# Patient Record
Sex: Male | Born: 1950 | Race: White | Hispanic: No | Marital: Married | Smoking: Never smoker
Health system: Southern US, Community
[De-identification: ages and names within clinical notes are randomized; demographics above are authoritative.]

## PROBLEM LIST (undated history)

## (undated) DIAGNOSIS — Z87442 Personal history of urinary calculi: Secondary | ICD-10-CM

## (undated) DIAGNOSIS — I10 Essential (primary) hypertension: Secondary | ICD-10-CM

## (undated) HISTORY — PX: COLONOSCOPY: SHX174

---

## 2008-07-25 ENCOUNTER — Ambulatory Visit: Payer: Self-pay | Admitting: Gastroenterology

## 2009-01-13 ENCOUNTER — Inpatient Hospital Stay: Payer: Self-pay | Admitting: Internal Medicine

## 2009-01-17 ENCOUNTER — Ambulatory Visit: Payer: Self-pay | Admitting: Oncology

## 2009-02-06 ENCOUNTER — Ambulatory Visit: Payer: Self-pay | Admitting: Oncology

## 2009-02-17 ENCOUNTER — Ambulatory Visit: Payer: Self-pay | Admitting: Oncology

## 2019-08-08 ENCOUNTER — Other Ambulatory Visit: Payer: Self-pay

## 2019-08-08 ENCOUNTER — Other Ambulatory Visit (HOSPITAL_COMMUNITY): Payer: Self-pay | Admitting: Internal Medicine

## 2019-08-08 ENCOUNTER — Ambulatory Visit
Admission: RE | Admit: 2019-08-08 | Discharge: 2019-08-08 | Disposition: A | Discharge status: Home / Self Care | Payer: Medicare Other | Source: Ambulatory Visit | Attending: Internal Medicine | Admitting: Internal Medicine

## 2019-08-08 ENCOUNTER — Other Ambulatory Visit: Payer: Self-pay | Admitting: Internal Medicine

## 2019-08-08 DIAGNOSIS — R0609 Other forms of dyspnea: Secondary | ICD-10-CM

## 2019-08-08 DIAGNOSIS — R0600 Dyspnea, unspecified: Secondary | ICD-10-CM | POA: Insufficient documentation

## 2019-08-08 DIAGNOSIS — R0789 Other chest pain: Secondary | ICD-10-CM

## 2019-08-08 HISTORY — DX: Essential (primary) hypertension: I10

## 2019-08-08 MED ORDER — IOHEXOL 350 MG/ML SOLN
75.0000 mL | Freq: Once | INTRAVENOUS | Status: AC | PRN
  Administered 2019-08-08: 75 mL via INTRAVENOUS

## 2020-09-07 LAB — EXTERNAL GENERIC LAB PROCEDURE: COLOGUARD: NEGATIVE

## 2020-11-02 ENCOUNTER — Other Ambulatory Visit: Payer: Self-pay

## 2020-11-02 ENCOUNTER — Other Ambulatory Visit: Payer: Self-pay | Admitting: Physician Assistant

## 2020-11-02 ENCOUNTER — Ambulatory Visit
Admission: RE | Admit: 2020-11-02 | Discharge: 2020-11-02 | Disposition: A | Discharge status: Home / Self Care | Payer: Medicare Other | Source: Ambulatory Visit | Attending: Physician Assistant | Admitting: Physician Assistant

## 2020-11-02 DIAGNOSIS — R109 Unspecified abdominal pain: Secondary | ICD-10-CM | POA: Diagnosis not present

## 2020-11-07 ENCOUNTER — Other Ambulatory Visit: Payer: Self-pay

## 2020-11-07 ENCOUNTER — Encounter: Payer: Self-pay | Admitting: Urology

## 2020-11-07 ENCOUNTER — Other Ambulatory Visit: Payer: Self-pay | Admitting: Urology

## 2020-11-07 ENCOUNTER — Ambulatory Visit
Admission: RE | Admit: 2020-11-07 | Discharge: 2020-11-07 | Disposition: A | Discharge status: Home / Self Care | Payer: Medicare Other | Source: Ambulatory Visit | Attending: Urology | Admitting: Urology

## 2020-11-07 ENCOUNTER — Ambulatory Visit (INDEPENDENT_AMBULATORY_CARE_PROVIDER_SITE_OTHER): Payer: Medicare Other | Admitting: Urology

## 2020-11-07 VITALS — BP 109/67 | HR 82 | Ht 71.0 in | Wt 215.0 lb

## 2020-11-07 DIAGNOSIS — N200 Calculus of kidney: Secondary | ICD-10-CM | POA: Diagnosis not present

## 2020-11-07 DIAGNOSIS — N201 Calculus of ureter: Secondary | ICD-10-CM

## 2020-11-07 DIAGNOSIS — N132 Hydronephrosis with renal and ureteral calculous obstruction: Secondary | ICD-10-CM

## 2020-11-07 IMAGING — CR DG ABDOMEN 1V
2 series · 2 of 2 positions shown · non-contrast
Comparison: CT from [DATE]

CLINICAL DATA: Right-sided flank pain for 5 days

EXAM:
ABDOMEN - 1 VIEW

[abdomen kub (1 of 2)]
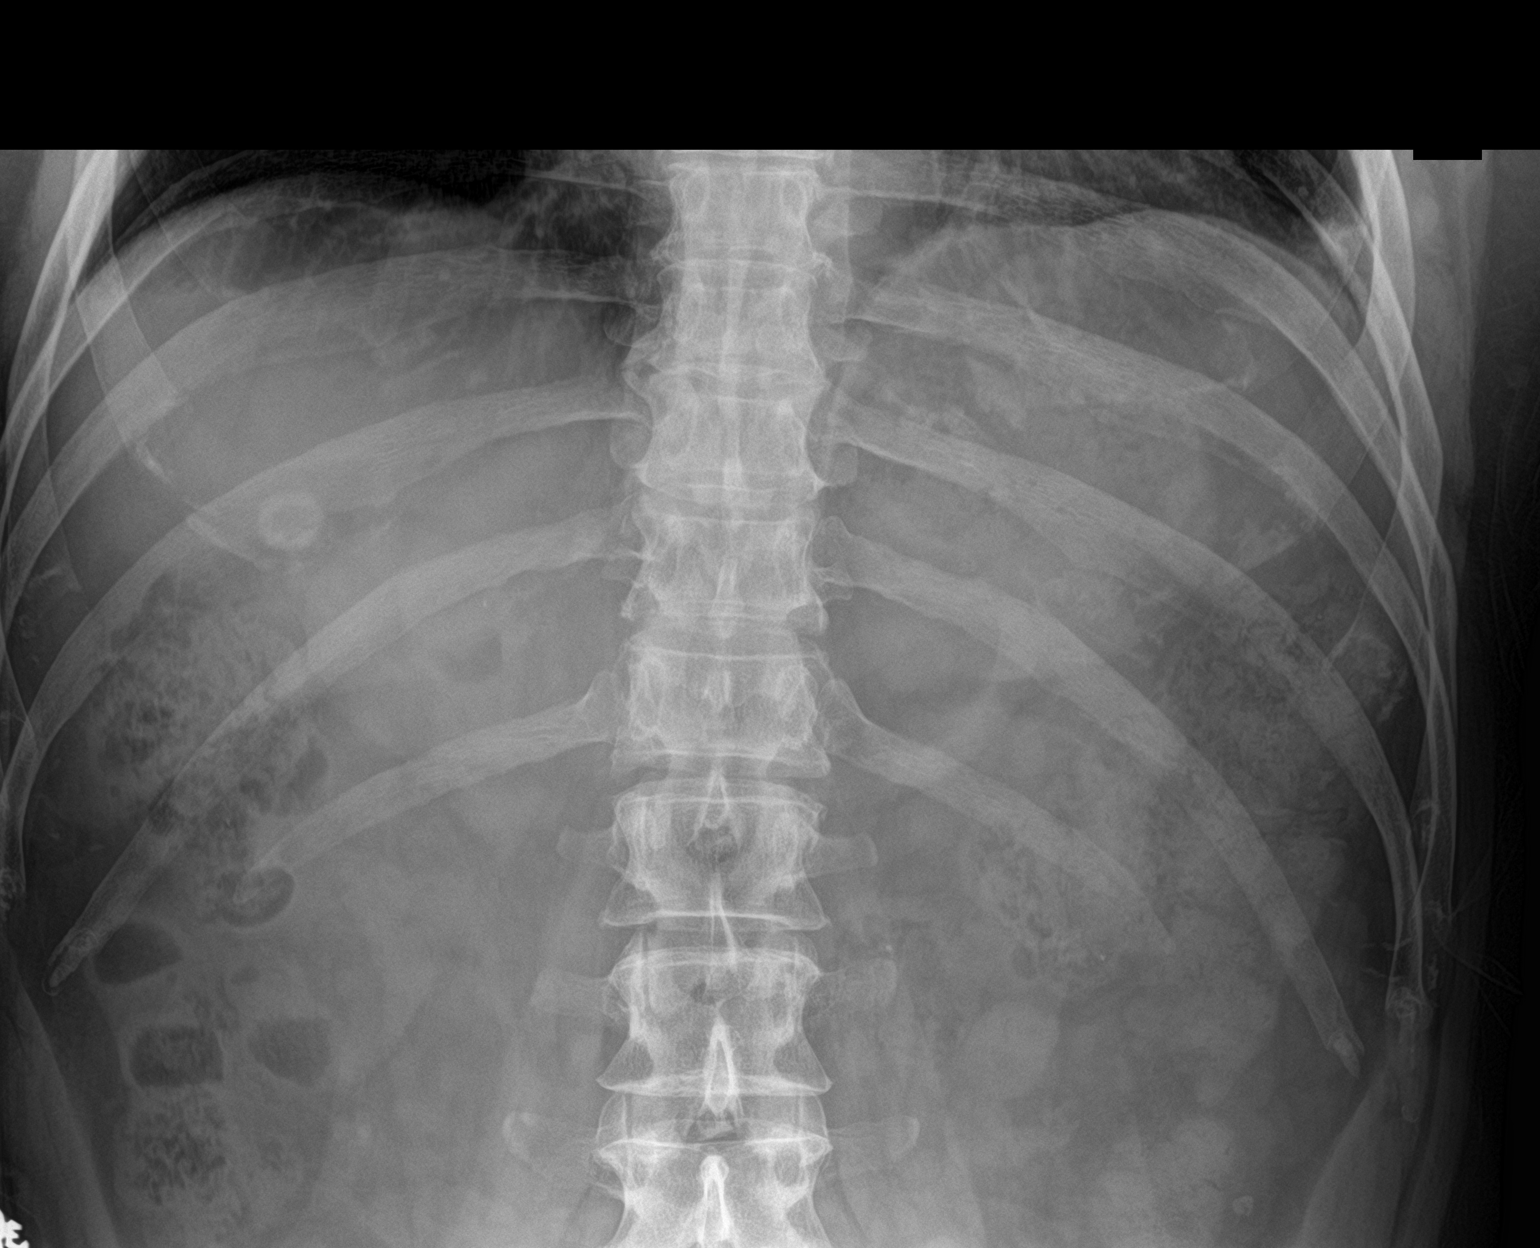

[abdomen kub (2 of 2)]
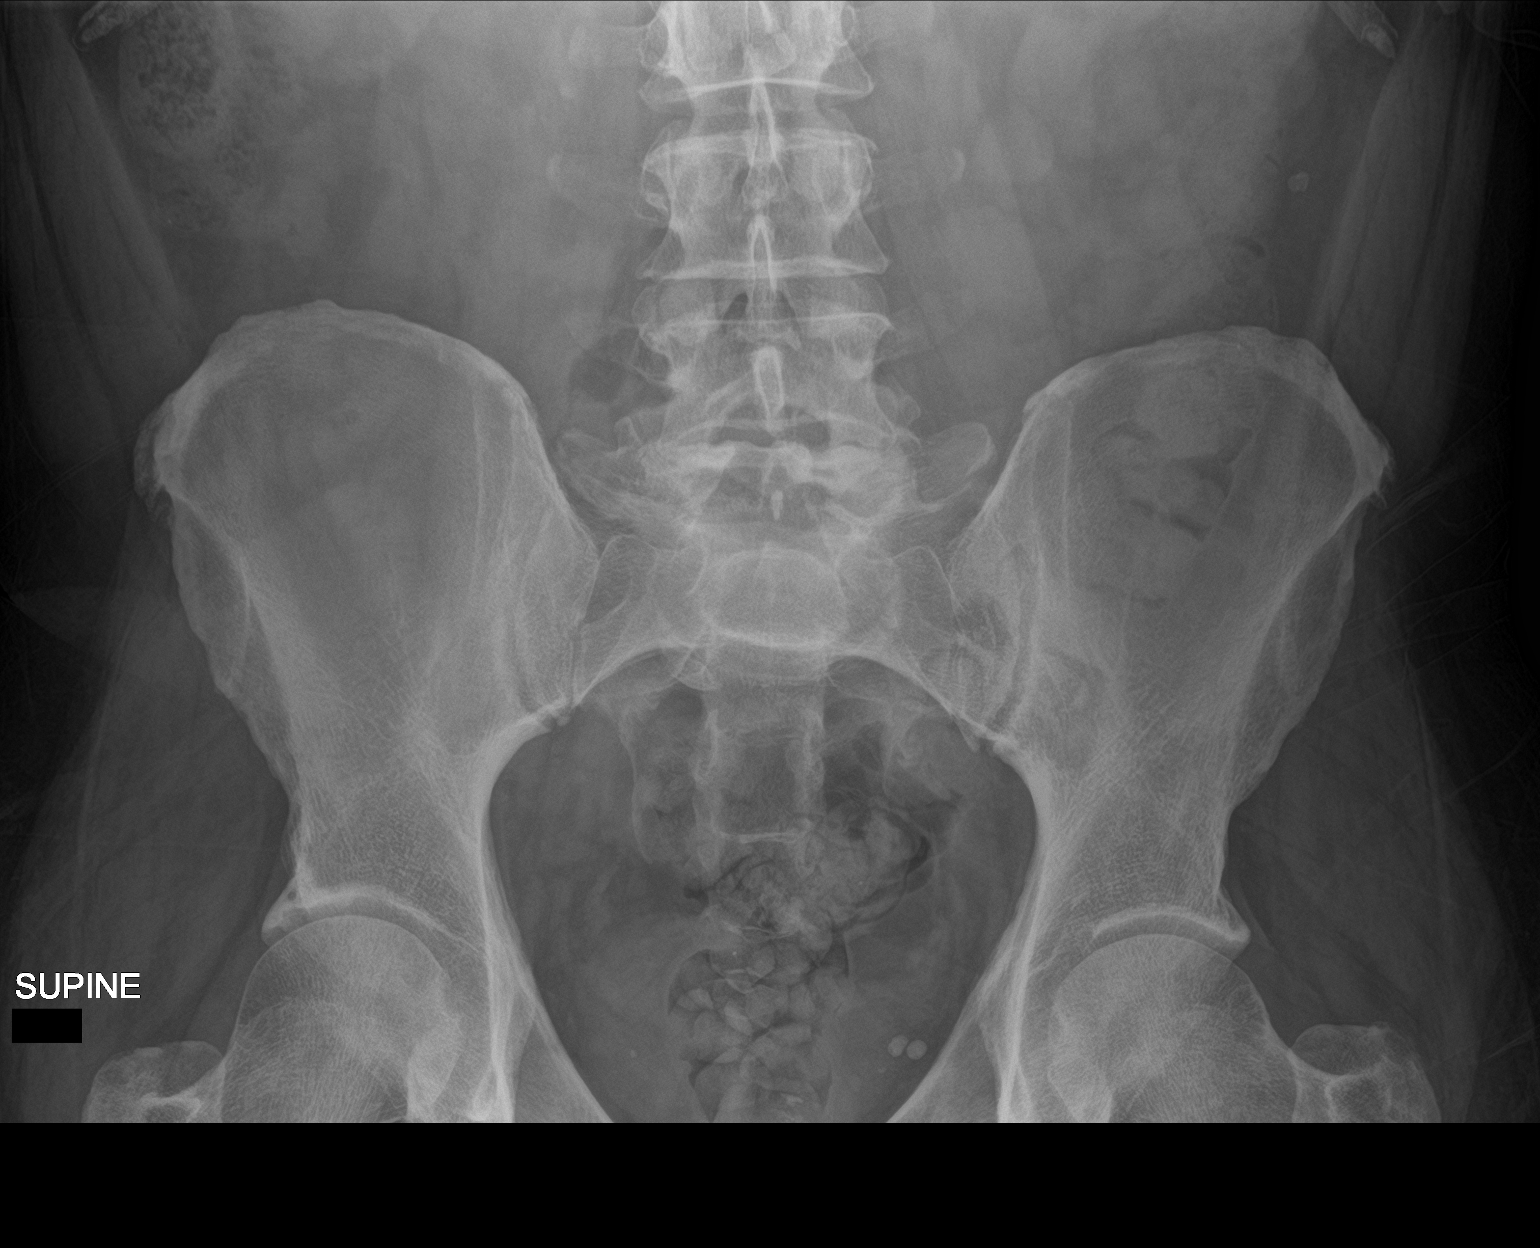

[2 of 2 positions shown; findings below may reference images not displayed]

FINDINGS: Scattered large and small bowel gas is noted. Lamellated
calcification is noted in the right upper quadrant consistent with
the known history of cholelithiasis. Previously seen proximal right
ureteral stone is again identified in similar position. Phleboliths
are noted within the pelvis. No other calcifications are identified.
IMPRESSION: Stable appearing proximal right ureteral stone.

Cholelithiasis.

## 2020-11-07 NOTE — H&P (View-Only) (Signed)
11/07/2020 4:41 PM   Kevin Grant [birth year] [id number]  Referring provider: Danella Penton, MD [phone] [facility] [address] [facility] [place],  [place] [address]  Chief Complaint  Patient presents with   Nephrolithiasis    HPI: Kevin Grant is a 70 y.o. male referred for evaluation of a right ureteral calculus.  Seen [facility] 11/02/2020 with acute onset of right flank pain Intensity rated severe without identifiable precipitating, aggravating or alleviating factors He had + nausea without vomiting, no fever but intermittent chills He received a ketorolac injection in the office and was discharged on Zofran/Percocet and a CT was ordered Stone protocol CT performed 11/02/2020 remarkable for a 3 x 5 mm right proximal ureteral calculus with mild hydronephrosis.  No additional calculi seen.  Incidentally noted to have cholelithiasis Since his office visit 9/16 his pain has significantly improved and has been minimal for the past few days No prior history of stone disease He is taking tamsulosin  PMH: Past Medical History:  Diagnosis Date   Hypertension     Surgical History: None  Home Medications:  Allergies as of 11/07/2020   No Known Allergies      Medication List        Accurate as of November 07, 2020  4:41 PM. If you have any questions, ask your nurse or doctor.          aspirin 81 MG EC tablet Take by mouth.   lisinopril-hydrochlorothiazide 20-12.5 MG tablet Commonly known as: ZESTORETIC Take by mouth.   metoprolol tartrate 25 MG tablet Commonly known as: LOPRESSOR Take by mouth.   simvastatin 20 MG tablet Commonly known as: ZOCOR Take by mouth.   tamsulosin 0.4 MG Caps capsule Commonly known as: FLOMAX Take by mouth.        Allergies: No Known Allergies  Family History: No family history on file.  Social History:  reports that he has never smoked. He has never used smokeless tobacco. He reports that he does  not drink alcohol. No history on file for drug use.   Physical Exam: BP 109/67   Pulse 82   Ht 5\' 11"  (1.803 m)   Wt 215 lb (97.5 kg)   BMI 29.99 kg/m   Constitutional:  Alert and oriented, No acute distress. HEENT: [place] AT, moist mucus membranes.  Trachea midline, no masses. Cardiovascular: RRR Respiratory: Normal respiratory effort, clear Skin: No rashes, bruises or suspicious lesions. Neurologic: Grossly intact, no focal deficits, moving all 4 extremities. Psychiatric: Normal mood and affect.  Laboratory Data:  Urinalysis Dipstick 1+ blood/negative leukocytes/negative nitrates Microscopy 6-10 WBC/3-10 RBC/no bacteria  Pertinent Imaging: CT images personally reviewed and interpreted.  Calculus measures 3 x 5 mm with density 600-700 HU  KUB performed today was reviewed and the calcification is identified below the L3 transverse process   CT RENAL STONE STUDY  Narrative CLINICAL DATA:  Right flank pain  EXAM: CT ABDOMEN AND PELVIS WITHOUT CONTRAST  TECHNIQUE: Multidetector CT imaging of the abdomen and pelvis was performed following the standard protocol without IV contrast.  COMPARISON:  Overlapping portion of CT chest 08/08/2019  FINDINGS: Lower chest: Mild scarring or atelectasis in the left lower lobe. Punctate 2 mm calcified pulmonary nodule along the left major fissure, compatible with benign granuloma.  Hepatobiliary: 1.5 cm gallstone in the gallbladder. Otherwise unremarkable.  Pancreas: Unremarkable  Spleen: Unremarkable  Adrenals/Urinary Tract: Both adrenal glands appear normal.  There is mild right hydronephrosis extending to a 0.5 cm in long  axis proximal ureteral stone shown on image 69 series 5 and image 40 series 2. Distal to this stone the right ureter has a normal caliber.  No left renal calculi are present. Suspected left peripelvic cysts versus less likely mild left hydronephrosis. No left hydroureter or left-sided  stone.  Stomach/Bowel: Unremarkable  Vascular/Lymphatic: Atherosclerosis is present, including aortoiliac atherosclerotic disease. No pathologic adenopathy identified.  Reproductive: Unremarkable  Other: No supplemental non-categorized findings.  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 3 mm of anterolisthesis of L5 on S1.  Mildly fatty left spermatic cord.  IMPRESSION: 1. Mildly obstructive 5 mm in long axis right proximal ureteral stone associated with mild right hydronephrosis. 2. Fluid density fullness along the left collecting system is probably from left peripelvic cysts, less likely to be from mild left hydronephrosis. No left-sided stone observed. 3. Chronic right pars defect at L5 with spina bifida occulta at L5, and 3 mm of anterolisthesis of L5 on S1. 4. Cholelithiasis.   Electronically Signed By: Gaylyn Rong M.D. On: 11/02/2020 16:06   Assessment & Plan:    1.  Right proximal ureteral calculus We discussed various treatment options for urolithiasis including observation with or without medical expulsive therapy, shockwave lithotripsy (SWL), ureteroscopy and laser lithotripsy with stent placement, and percutaneous nephrolithotomy.  We discussed that management is based on stone size, location, density, patient co-morbidities, and patient preference.   Stones <66mm in size have a >80% spontaneous passage rate. Data surrounding the use of tamsulosin for medical expulsive therapy is controversial, but meta analyses suggests it is most efficacious for distal stones between 5-60mm in size. Possible side effects include dizziness/lightheadedness, and retrograde ejaculation.  SWL has a lower stone free rate in a single procedure, but also a lower complication rate compared to ureteroscopy and avoids a stent and associated stent related symptoms. Possible complications include renal hematoma, steinstrasse,  and need for additional treatment.  Ureteroscopy with laser lithotripsy and stent placement has a higher stone free rate than SWL in a single procedure, however increased complication rate including possible infection, ureteral injury, bleeding, and stent related morbidity. Common stent related symptoms include dysuria, urgency/frequency, and flank pain.  PCNL is the favored treatment for stones >2cm. It involves a small incision in the flank, with complete fragmentation of stones and removal. It has the highest stone free rate, but also the highest complication rate. Possible complications include bleeding, infection/sepsis, injury to surrounding organs including the pleura, and collecting system injury.   After an extensive discussion of the risks and benefits of the above treatment options, the patient would like to proceed with SWL.   Riki Altes, MD  [facility] [address] [place], [place] [address] [phone]

## 2020-11-07 NOTE — Progress Notes (Signed)
ESWL ORDER FORM  Expected date of procedure: 11/08/2020  Post op standing: 2-4wk PA follow up w/KUB prior  Anticoagulation/Aspirin/NSAID standing order: N/AA  Anesthesia standing order: MAC  VTE standing: None  Dx: Right Ureteral Stone  Procedure: right Extracorporeal shock wave lithotripsy  CPT : 06237  Standing Order Set:   *NPO after mn, KUB  *NS 167m/hr, Keflex 5054mPO, Benadryl 2551mO, Valium 46m79m, Zofran 4mg 63m   Medications if other than standing orders: N/A

## 2020-11-07 NOTE — Progress Notes (Signed)
11/07/2020 4:41 PM   Kevin Grant 05-06-1950 [id number]  Referring provider: Danella Penton, MD [phone] [facility] [address] [facility] [place],  [place] [address]  Chief Complaint  Patient presents with   Nephrolithiasis    HPI: Kevin Grant is a 70 y.o. male referred for evaluation of a right ureteral calculus.  Seen [facility] 11/02/2020 with acute onset of right flank pain Intensity rated severe without identifiable precipitating, aggravating or alleviating factors He had + nausea without vomiting, no fever but intermittent chills He received a ketorolac injection in the office and was discharged on Zofran/Percocet and a CT was ordered Stone protocol CT performed 11/02/2020 remarkable for a 3 x 5 mm right proximal ureteral calculus with mild hydronephrosis.  No additional calculi seen.  Incidentally noted to have cholelithiasis Since his office visit 9/16 his pain has significantly improved and has been minimal for the past few days No prior history of stone disease He is taking tamsulosin  PMH: Past Medical History:  Diagnosis Date   Hypertension     Surgical History: None  Home Medications:  Allergies as of 11/07/2020   No Known Allergies      Medication List        Accurate as of November 07, 2020  4:41 PM. If you have any questions, ask your nurse or doctor.          aspirin 81 MG EC tablet Take by mouth.   lisinopril-hydrochlorothiazide 20-12.5 MG tablet Commonly known as: ZESTORETIC Take by mouth.   metoprolol tartrate 25 MG tablet Commonly known as: LOPRESSOR Take by mouth.   simvastatin 20 MG tablet Commonly known as: ZOCOR Take by mouth.   tamsulosin 0.4 MG Caps capsule Commonly known as: FLOMAX Take by mouth.        Allergies: No Known Allergies  Family History: No family history on file.  Social History:  reports that he has never smoked. He has never used smokeless tobacco. He reports that he does  not drink alcohol. No history on file for drug use.   Physical Exam: BP 109/67   Pulse 82   Ht 5\' 11"  (1.803 m)   Wt 215 lb (97.5 kg)   BMI 29.99 kg/m   Constitutional:  Alert and oriented, No acute distress. HEENT: [place] AT, moist mucus membranes.  Trachea midline, no masses. Cardiovascular: RRR Respiratory: Normal respiratory effort, clear Skin: No rashes, bruises or suspicious lesions. Neurologic: Grossly intact, no focal deficits, moving all 4 extremities. Psychiatric: Normal mood and affect.  Laboratory Data:  Urinalysis Dipstick 1+ blood/negative leukocytes/negative nitrates Microscopy 6-10 WBC/3-10 RBC/no bacteria  Pertinent Imaging: CT images personally reviewed and interpreted.  Calculus measures 3 x 5 mm with density 600-700 HU  KUB performed today was reviewed and the calcification is identified below the L3 transverse process   CT RENAL STONE STUDY  Narrative CLINICAL DATA:  Right flank pain  EXAM: CT ABDOMEN AND PELVIS WITHOUT CONTRAST  TECHNIQUE: Multidetector CT imaging of the abdomen and pelvis was performed following the standard protocol without IV contrast.  COMPARISON:  Overlapping portion of CT chest 08/08/2019  FINDINGS: Lower chest: Mild scarring or atelectasis in the left lower lobe. Punctate 2 mm calcified pulmonary nodule along the left major fissure, compatible with benign granuloma.  Hepatobiliary: 1.5 cm gallstone in the gallbladder. Otherwise unremarkable.  Pancreas: Unremarkable  Spleen: Unremarkable  Adrenals/Urinary Tract: Both adrenal glands appear normal.  There is mild right hydronephrosis extending to a 0.5 cm in long  axis proximal ureteral stone shown on image 69 series 5 and image 40 series 2. Distal to this stone the right ureter has a normal caliber.  No left renal calculi are present. Suspected left peripelvic cysts versus less likely mild left hydronephrosis. No left hydroureter or left-sided  stone.  Stomach/Bowel: Unremarkable  Vascular/Lymphatic: Atherosclerosis is present, including aortoiliac atherosclerotic disease. No pathologic adenopathy identified.  Reproductive: Unremarkable  Other: No supplemental non-categorized findings.  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 3 mm of anterolisthesis of L5 on S1.  Mildly fatty left spermatic cord.  IMPRESSION: 1. Mildly obstructive 5 mm in long axis right proximal ureteral stone associated with mild right hydronephrosis. 2. Fluid density fullness along the left collecting system is probably from left peripelvic cysts, less likely to be from mild left hydronephrosis. No left-sided stone observed. 3. Chronic right pars defect at L5 with spina bifida occulta at L5, and 3 mm of anterolisthesis of L5 on S1. 4. Cholelithiasis.   Electronically Signed By: Gaylyn Rong M.D. On: 11/02/2020 16:06   Assessment & Plan:    1.  Right proximal ureteral calculus We discussed various treatment options for urolithiasis including observation with or without medical expulsive therapy, shockwave lithotripsy (SWL), ureteroscopy and laser lithotripsy with stent placement, and percutaneous nephrolithotomy.  We discussed that management is based on stone size, location, density, patient co-morbidities, and patient preference.   Stones <66mm in size have a >80% spontaneous passage rate. Data surrounding the use of tamsulosin for medical expulsive therapy is controversial, but meta analyses suggests it is most efficacious for distal stones between 5-60mm in size. Possible side effects include dizziness/lightheadedness, and retrograde ejaculation.  SWL has a lower stone free rate in a single procedure, but also a lower complication rate compared to ureteroscopy and avoids a stent and associated stent related symptoms. Possible complications include renal hematoma, steinstrasse,  and need for additional treatment.  Ureteroscopy with laser lithotripsy and stent placement has a higher stone free rate than SWL in a single procedure, however increased complication rate including possible infection, ureteral injury, bleeding, and stent related morbidity. Common stent related symptoms include dysuria, urgency/frequency, and flank pain.  PCNL is the favored treatment for stones >2cm. It involves a small incision in the flank, with complete fragmentation of stones and removal. It has the highest stone free rate, but also the highest complication rate. Possible complications include bleeding, infection/sepsis, injury to surrounding organs including the pleura, and collecting system injury.   After an extensive discussion of the risks and benefits of the above treatment options, the patient would like to proceed with SWL.   Kevin Altes, MD  [facility] [address] [place], [place] [address] [phone]

## 2020-11-08 ENCOUNTER — Ambulatory Visit: Payer: Medicare Other

## 2020-11-08 ENCOUNTER — Other Ambulatory Visit: Payer: Self-pay | Admitting: Urology

## 2020-11-08 ENCOUNTER — Other Ambulatory Visit: Payer: Self-pay

## 2020-11-08 ENCOUNTER — Ambulatory Visit
Admission: RE | Admit: 2020-11-08 | Discharge: 2020-11-08 | Disposition: A | Discharge status: Home / Self Care | Payer: Medicare Other | Source: Ambulatory Visit | Attending: Urology | Admitting: Urology

## 2020-11-08 ENCOUNTER — Telehealth: Payer: Self-pay

## 2020-11-08 ENCOUNTER — Encounter: Payer: Self-pay | Admitting: Urology

## 2020-11-08 ENCOUNTER — Encounter
Admission: RE | Disposition: A | Discharge status: Home / Self Care | Payer: Self-pay | Source: Ambulatory Visit | Attending: Urology

## 2020-11-08 DIAGNOSIS — Q760 Spina bifida occulta: Secondary | ICD-10-CM | POA: Diagnosis not present

## 2020-11-08 DIAGNOSIS — K8020 Calculus of gallbladder without cholecystitis without obstruction: Secondary | ICD-10-CM | POA: Diagnosis not present

## 2020-11-08 DIAGNOSIS — N201 Calculus of ureter: Secondary | ICD-10-CM | POA: Diagnosis present

## 2020-11-08 DIAGNOSIS — N132 Hydronephrosis with renal and ureteral calculous obstruction: Secondary | ICD-10-CM | POA: Insufficient documentation

## 2020-11-08 HISTORY — PX: EXTRACORPOREAL SHOCK WAVE LITHOTRIPSY: SHX1557

## 2020-11-08 LAB — URINALYSIS, COMPLETE
Bilirubin, UA: NEGATIVE
Glucose, UA: NEGATIVE
Ketones, UA: NEGATIVE
Leukocytes,UA: NEGATIVE
Nitrite, UA: NEGATIVE
Protein,UA: NEGATIVE
Specific Gravity, UA: 1.025 (ref 1.005–1.030)
Urobilinogen, Ur: 0.2 mg/dL (ref 0.2–1.0)
pH, UA: 5.5 (ref 5.0–7.5)

## 2020-11-08 LAB — MICROSCOPIC EXAMINATION
Bacteria, UA: NONE SEEN
Epithelial Cells (non renal): NONE SEEN /hpf (ref 0–10)

## 2020-11-08 SURGERY — LITHOTRIPSY, ESWL
Anesthesia: Choice | Laterality: Right

## 2020-11-08 MED ORDER — ONDANSETRON 4 MG PO TBDP: 4.0000 mg | ORAL_TABLET | Freq: Three times a day (TID) | ORAL | 0 refills | Status: DC | PRN

## 2020-11-08 MED ORDER — DIPHENHYDRAMINE HCL 25 MG PO CAPS
25.0000 mg | ORAL_CAPSULE | ORAL | Status: AC
  Administered 2020-11-08: 25 mg via ORAL

## 2020-11-08 MED ORDER — DIAZEPAM 5 MG PO TABS
10.0000 mg | ORAL_TABLET | ORAL | Status: AC
  Administered 2020-11-08: 10 mg via ORAL

## 2020-11-08 MED ORDER — SODIUM CHLORIDE 0.9 % IV SOLN: INTRAVENOUS | Status: DC

## 2020-11-08 MED ORDER — CEPHALEXIN 500 MG PO CAPS
ORAL_CAPSULE | ORAL | Status: AC
  Filled 2020-11-08: qty 1

## 2020-11-08 MED ORDER — ONDANSETRON HCL 4 MG/2ML IJ SOLN
INTRAMUSCULAR | Status: AC
  Administered 2020-11-08: 4 mg via INTRAVENOUS
  Filled 2020-11-08: qty 2

## 2020-11-08 MED ORDER — CEPHALEXIN 500 MG PO CAPS
500.0000 mg | ORAL_CAPSULE | Freq: Once | ORAL | Status: AC
  Administered 2020-11-08: 500 mg via ORAL

## 2020-11-08 MED ORDER — HYDROCODONE-ACETAMINOPHEN 5-325 MG PO TABS: 1.0000 | ORAL_TABLET | Freq: Four times a day (QID) | ORAL | 0 refills | Status: DC | PRN

## 2020-11-08 MED ORDER — ONDANSETRON HCL 4 MG/2ML IJ SOLN: 4.0000 mg | Freq: Once | INTRAMUSCULAR | Status: AC

## 2020-11-08 MED ORDER — DIAZEPAM 5 MG PO TABS
ORAL_TABLET | ORAL | Status: AC
  Filled 2020-11-08: qty 2

## 2020-11-08 MED ORDER — DIPHENHYDRAMINE HCL 25 MG PO CAPS
ORAL_CAPSULE | ORAL | Status: AC
  Filled 2020-11-08: qty 1

## 2020-11-08 NOTE — Telephone Encounter (Signed)
Called pt's wife back she states that the pt denies chills, temp is normal at 98.0. Pt has had 2 tablets of Norco, thought the patient vomited shortly after taking the first tablet. The second tablet of Norco has stayed down. The patient has taken one dose of Zofran that he had from an old prescription, this has helped the nausea some. Pain is still 8/10 on the RT flank. Per Carollee Herter we will send RX for nausea medication and the patient is to take another dose of Norco in 2 hours. If pain is not resolved with additional Norco pt is to seek care in the ED. Wife voiced understanding.

## 2020-11-08 NOTE — Telephone Encounter (Signed)
Pt's wife LM on triage line stating that the pt had litho this morning and he is currently having pain not relieved with norco. He has had 1 episode of vomiting, and remains nauseated. Denies fever or chills. Please advise.

## 2020-11-08 NOTE — Interval H&P Note (Signed)
History and Physical Interval Note:  11/08/2020 9:02 AM  Kevin Grant  has presented today for surgery, with the diagnosis of Right ureteral stone.  The various methods of treatment have been discussed with the patient and family. After consideration of risks, benefits and other options for treatment, the patient has consented to  Procedure(s): EXTRACORPOREAL SHOCK WAVE LITHOTRIPSY (ESWL) (Right) as a surgical intervention.  The patient's history has been reviewed, patient examined, no change in status, stable for surgery.  I have reviewed the patient's chart and labs.  Questions were answered to the patient's satisfaction.     Vanna Scotland

## 2020-11-08 NOTE — Discharge Instructions (Addendum)
See [facility] discharge instructions in chart. AMBULATORY SURGERY  DISCHARGE INSTRUCTIONS   The drugs that you were given will stay in your system until tomorrow so for the next 24 hours you should not:  Drive an automobile Make any legal decisions Drink any alcoholic beverage   You may resume regular meals tomorrow.  Today it is better to start with liquids and gradually work up to solid foods.  You may eat anything you prefer, but it is better to start with liquids, then soup and crackers, and gradually work up to solid foods.   Please notify your doctor immediately if you have any unusual bleeding, trouble breathing, redness and pain at the surgery site, drainage, fever, or pain not relieved by medication.    Additional Instructions:        Please contact your physician with any problems or Same Day Surgery at [phone], Monday through Friday 6 am to 4 pm, or [place] [facility] [place] Main number at [phone].  

## 2020-11-09 ENCOUNTER — Ambulatory Visit (INDEPENDENT_AMBULATORY_CARE_PROVIDER_SITE_OTHER): Payer: Medicare Other | Admitting: Physician Assistant

## 2020-11-09 ENCOUNTER — Ambulatory Visit
Admission: RE | Admit: 2020-11-09 | Discharge: 2020-11-09 | Disposition: A | Discharge status: Home / Self Care | Payer: Medicare Other | Source: Ambulatory Visit | Attending: Physician Assistant | Admitting: Physician Assistant

## 2020-11-09 ENCOUNTER — Encounter: Payer: Self-pay | Admitting: Physician Assistant

## 2020-11-09 ENCOUNTER — Ambulatory Visit
Admission: RE | Admit: 2020-11-09 | Discharge: 2020-11-09 | Disposition: A | Discharge status: Home / Self Care | Payer: Medicare Other | Attending: Physician Assistant | Admitting: Physician Assistant

## 2020-11-09 ENCOUNTER — Other Ambulatory Visit: Payer: Self-pay | Admitting: Physician Assistant

## 2020-11-09 VITALS — BP 133/73 | HR 56 | Ht 72.0 in | Wt 215.0 lb

## 2020-11-09 DIAGNOSIS — N201 Calculus of ureter: Secondary | ICD-10-CM

## 2020-11-09 LAB — URINALYSIS, COMPLETE
Bilirubin, UA: NEGATIVE
Glucose, UA: NEGATIVE
Ketones, UA: NEGATIVE
Leukocytes,UA: NEGATIVE
Nitrite, UA: NEGATIVE
Specific Gravity, UA: 1.025 (ref 1.005–1.030)
Urobilinogen, Ur: 0.2 mg/dL (ref 0.2–1.0)
pH, UA: 5.5 (ref 5.0–7.5)

## 2020-11-09 LAB — MICROSCOPIC EXAMINATION
Bacteria, UA: NONE SEEN
RBC, Urine: >30 /hpf — AB (ref 0–2)

## 2020-11-09 MED ORDER — KETOROLAC TROMETHAMINE 30 MG/ML IJ SOLN
15.0000 mg | Freq: Once | INTRAMUSCULAR | Status: AC
  Administered 2020-11-09: 15 mg via INTRAMUSCULAR

## 2020-11-09 MED ORDER — HYDROCODONE-ACETAMINOPHEN 5-325 MG PO TABS: 1.0000 | ORAL_TABLET | Freq: Four times a day (QID) | ORAL | 0 refills | Status: DC | PRN

## 2020-11-09 NOTE — Progress Notes (Signed)
11/09/2020 4:38 PM   Kevin Grant 06-08-1950 [id number]  CC: Chief Complaint  Patient presents with   Nephrolithiasis   HPI: Kevin Grant is a 70 y.o. male who underwent ESWL with Dr. Apolinar Junes yesterday for management of a 5 mm proximal right ureteral stone who presents today for evaluation of right flank pain and nausea.   Today he reports severe right flank pain yesterday following the procedure not improved with oral pain medications but has become more controllable today as well as nausea without vomiting.  He has been prescribed Zofran, but has not yet picked this up from the pharmacy.  He has noticed some gross hematuria.  He denies a history of GI surgery or bleeds.  No history of CKD.  KUB today with interval distal migration of his right ureteral stone, now at L3.  In-office UA today positive for 3+ blood and 1+ protein; urine microscopy with >30 RBCs/HPF.  PMH: Past Medical History:  Diagnosis Date   Hypertension     Surgical History: Past Surgical History:  Procedure Laterality Date   EXTRACORPOREAL SHOCK WAVE LITHOTRIPSY Right 11/08/2020   Procedure: EXTRACORPOREAL SHOCK WAVE LITHOTRIPSY (ESWL);  Surgeon: Vanna Scotland, MD;  Location: [facility] ORS;  Service: Urology;  Laterality: Right;    Home Medications:  Allergies as of 11/09/2020   No Known Allergies      Medication List        Accurate as of November 09, 2020  4:38 PM. If you have any questions, ask your nurse or doctor.          aspirin 81 MG EC tablet Take by mouth.   HYDROcodone-acetaminophen 5-325 MG tablet Commonly known as: NORCO/VICODIN Take 1-2 tablets by mouth every 6 (six) hours as needed for moderate pain.   lisinopril-hydrochlorothiazide 20-12.5 MG tablet Commonly known as: ZESTORETIC Take by mouth.   metoprolol tartrate 25 MG tablet Commonly known as: LOPRESSOR Take by mouth.   ondansetron 4 MG disintegrating tablet Commonly known as: Zofran ODT Take 1 tablet (4  mg total) by mouth every 8 (eight) hours as needed for nausea or vomiting.   simvastatin 20 MG tablet Commonly known as: ZOCOR Take by mouth.   tamsulosin 0.4 MG Caps capsule Commonly known as: FLOMAX Take by mouth.        Allergies:  No Known Allergies  Family History: History reviewed. No pertinent family history.  Social History:   reports that he has never smoked. He has never used smokeless tobacco. He reports that he does not drink alcohol and does not use drugs.  Physical Exam: BP 133/73   Pulse (!) 56   Ht 6' (1.829 m)   Wt 215 lb (97.5 kg)   BMI 29.16 kg/m   Constitutional:  Alert and oriented, no acute distress, nontoxic appearing HEENT: [place], AT Cardiovascular: No clubbing, cyanosis, or edema Respiratory: Normal respiratory effort, no increased work of breathing GU: No CVA tenderness or flank ecchymosis Skin: No rashes, bruises or suspicious lesions Neurologic: Grossly intact, no focal deficits, moving all 4 extremities Psychiatric: Normal mood and affect  Laboratory Data: Results for orders placed or performed in visit on 11/09/20  Microscopic Examination   Urine  Result Value Ref Range   WBC, UA 0-5 0 - 5 /hpf   RBC >30 (A) 0 - 2 /hpf   Epithelial Cells (non renal) 0-10 0 - 10 /hpf   Bacteria, UA None seen None seen/Few  Urinalysis, Complete  Result Value Ref Range   Specific  Gravity, UA 1.025 1.005 - 1.030   pH, UA 5.5 5.0 - 7.5   Color, UA Yellow Yellow   Appearance Ur Cloudy (A) Clear   Leukocytes,UA Negative Negative   Protein,UA 1+ (A) Negative/Trace   Glucose, UA Negative Negative   Ketones, UA Negative Negative   RBC, UA 3+ (A) Negative   Bilirubin, UA Negative Negative   Urobilinogen, Ur 0.2 0.2 - 1.0 mg/dL   Nitrite, UA Negative Negative   Microscopic Examination See below:   Hemoglobin and hematocrit, blood  Result Value Ref Range   Hemoglobin 14.5 13.0 - 17.7 g/dL   Hematocrit 27.1 29.2 - 51.0 %   Pertinent Imaging: KUB,  11/09/2020: CLINICAL DATA:  Flank pain.  Status post lithotripsy.   EXAM: ABDOMEN - 1 VIEW   COMPARISON:  11/08/2020.  CT, 11/02/2020.   FINDINGS: No convincing renal or ureteral stones. Stable gallstone in the right upper quadrant.   Normal bowel gas pattern.   Skeletal structures are unremarkable.   IMPRESSION: 1. No acute findings.  No convincing renal or ureteral stones.     Electronically Signed   By: Amie Portland M.D.   On: 11/11/2020 15:21  I personally reviewed the images referenced above and note slight interval migration of his proximal right ureteral stone to the level of L3.  Assessment & Plan:   1. Right ureteral stone Right flank pain and nausea without vomiting this patient now 1 day s/p ESWL of a proximal right ureteral stone.  He is hemodynamically stable in clinic today and UA is reassuring for infection.  I offered him IM Toradol in clinic today, and he accepted.  Will prescribe additional Norco to last him through the weekend.  Counseled him to control his pain with Norco and nausea with Zofran.  We discussed return precautions today including uncontrollable pain, nausea, or vomiting.  He expressed understanding.  Out of an abundance of caution, will check H&H to rule out clinically significant hematoma following shockwave, though this is extremely unlikely in the setting of his stable vitals. - Urinalysis, Complete - Hemoglobin and hematocrit, blood - ketorolac (TORADOL) 30 MG/ML injection 15 mg - HYDROcodone-acetaminophen (NORCO/VICODIN) 5-325 MG tablet; Take 1-2 tablets by mouth every 6 (six) hours as needed for moderate pain.  Dispense: 10 tablet; Refill: 0  Return if symptoms worsen or fail to improve.  Carman Ching, PA-C  [facility] [address], [address] [place], [place] [address] [phone]

## 2020-11-10 LAB — HEMOGLOBIN AND HEMATOCRIT, BLOOD
Hematocrit: 43.8 % (ref 37.5–51.0)
Hemoglobin: 14.5 g/dL (ref 13.0–17.7)

## 2020-11-12 ENCOUNTER — Telehealth: Payer: Self-pay

## 2020-11-12 NOTE — Telephone Encounter (Signed)
Notified patient (wife) as advised, she expressed understanding.

## 2020-11-12 NOTE — Telephone Encounter (Signed)
-----   Message from Carman Ching, [place] sent at 11/12/2020  8:56 AM EDT ----- Blood counts look good, great news! ----- Message ----- From: Interface, [facility] Lab Results In Sent: 11/09/2020   4:36 PM EDT To: Carman Ching, PA-C

## 2020-11-15 ENCOUNTER — Encounter: Payer: Self-pay | Admitting: Urology

## 2020-11-19 ENCOUNTER — Other Ambulatory Visit: Payer: Self-pay

## 2020-11-19 DIAGNOSIS — N201 Calculus of ureter: Secondary | ICD-10-CM

## 2020-11-19 NOTE — Addendum Note (Signed)
Addended by: Filomena Jungling on: 11/19/2020 03:09 PM   Modules accepted: Orders

## 2020-11-22 LAB — CALCULI, WITH PHOTOGRAPH (CLINICAL LAB)
Calcium Oxalate Monohydrate: 100 %
Weight Calculi: 14 mg

## 2020-12-06 ENCOUNTER — Ambulatory Visit: Payer: Medicare Other | Admitting: Urology

## 2020-12-10 NOTE — Progress Notes (Signed)
12/11/2020 1:02 PM   Kevin Grant Nov 16, 1950 [id number]  Referring provider: Danella Penton, MD [phone] [facility] [address] [facility] [place],  [place] [address]  Chief Complaint  Patient presents with   Nephrolithiasis   Urological history: 1. Nephrolithiasis -stone composition 100% calcium oxalate monohydrate -ESWL on 11/08/2020 for 5 mm right proximal stone   HPI: Kevin Grant is a 70 y.o. male who presents today for a right ureteral stone.    UA negative  KUB no stones visualized   He has no urinary complaints at this time.  Patient denies any modifying or aggravating factors.  Patient denies any gross hematuria, dysuria or suprapubic/flank pain.  Patient denies any fevers, chills, nausea or vomiting.    PMH: Past Medical History:  Diagnosis Date   Hypertension     Surgical History: Past Surgical History:  Procedure Laterality Date   EXTRACORPOREAL SHOCK WAVE LITHOTRIPSY Right 11/08/2020   Procedure: EXTRACORPOREAL SHOCK WAVE LITHOTRIPSY (ESWL);  Surgeon: Vanna Scotland, MD;  Location: [facility] ORS;  Service: Urology;  Laterality: Right;    Home Medications:  Allergies as of 12/11/2020   No Known Allergies      Medication List        Accurate as of December 11, 2020 11:59 PM. If you have any questions, ask your nurse or doctor.          STOP taking these medications    HYDROcodone-acetaminophen 5-325 MG tablet Commonly known as: NORCO/VICODIN Stopped by: Suzannah Bettes, PA-C   ondansetron 4 MG disintegrating tablet Commonly known as: Zofran ODT Stopped by: Jeryl Umholtz, PA-C   tamsulosin 0.4 MG Caps capsule Commonly known as: FLOMAX Stopped by: Ryosuke Ericksen, PA-C       TAKE these medications    amLODipine 2.5 MG tablet Commonly known as: NORVASC Take by mouth.   aspirin 81 MG EC tablet Take by mouth.   lisinopril-hydrochlorothiazide 20-12.5 MG tablet Commonly known as: ZESTORETIC Take by mouth.    metoprolol tartrate 25 MG tablet Commonly known as: LOPRESSOR Take by mouth.   Multi-Vitamin tablet Take 1 tablet by mouth daily.   simvastatin 20 MG tablet Commonly known as: ZOCOR Take by mouth.        Allergies: No Known Allergies  Family History: History reviewed. No pertinent family history.  Social History:  reports that he has never smoked. He has never used smokeless tobacco. He reports that he does not drink alcohol and does not use drugs.  ROS: Pertinent ROS in HPI  Physical Exam: BP 133/80   Pulse (!) 56   Ht 6' (1.829 m)   Wt 215 lb (97.5 kg)   BMI 29.16 kg/m   Constitutional:  Well nourished. Alert and oriented, No acute distress. HEENT: [place] AT, mask in place.  Trachea midline Cardiovascular: No clubbing, cyanosis, or edema. Respiratory: Normal respiratory effort, no increased work of breathing. Neurologic: Grossly intact, no focal deficits, moving all 4 extremities. Psychiatric: Normal mood and affect.  Laboratory Data: Lab Results  Component Value Date   HGB 14.5 11/09/2020   HCT 43.8 11/09/2020   Urinalysis Component     Latest Ref Rng & Units 12/11/2020  Specific Gravity, UA     1.005 - 1.030 >1.030 (H)  pH, UA     5.0 - 7.5 5.5  Color, UA     Yellow Yellow  Appearance Ur     Clear Clear  Leukocytes,UA     Negative Negative  Protein,UA  Negative/Trace Negative  Glucose, UA     Negative Negative  Ketones, UA     Negative Negative  RBC, UA     Negative Negative  Bilirubin, UA     Negative Negative  Urobilinogen, Ur     0.2 - 1.0 mg/dL 0.2  Nitrite, UA     Negative Negative    I have reviewed the labs.   Pertinent Imaging: CLINICAL DATA:  Kidney stone.   EXAM: ABDOMEN - 1 VIEW   COMPARISON:  CT abdomen pelvis dated 11/01/2020.   FINDINGS: There is a 1 cm gallstone. No radiopaque calculi noted over the renal silhouette or urinary bladder. There is large amount of stool throughout the colon. No bowel dilatation or  evidence of obstruction. No free air. The osseous structures are intact.   IMPRESSION: 1. A 1 cm gallstone. 2. No radiopaque renal calculi. 3. Constipation. No bowel obstruction.     Electronically Signed   By: Elgie Collard M.D.   On: 12/12/2020 23:05 I have independently reviewed the films.  See HPI.   Assessment & Plan:    1. Right ureteral stone -s/p ESWL -discussed basic stone prevention -given ABC's of stone prevention and low oxalate diet handout   Return in about 6 months (around 06/11/2021) for KUB.  These notes generated with voice recognition software. I apologize for typographical errors.  [name], PA-C  [facility] [address]  [address] [place], [place] [address] [phone]

## 2020-12-11 ENCOUNTER — Ambulatory Visit
Admission: RE | Admit: 2020-12-11 | Discharge: 2020-12-11 | Disposition: A | Discharge status: Home / Self Care | Payer: Medicare Other | Attending: Urology | Admitting: Urology

## 2020-12-11 ENCOUNTER — Ambulatory Visit (INDEPENDENT_AMBULATORY_CARE_PROVIDER_SITE_OTHER): Payer: Medicare Other | Admitting: Urology

## 2020-12-11 ENCOUNTER — Encounter: Payer: Self-pay | Admitting: Urology

## 2020-12-11 ENCOUNTER — Other Ambulatory Visit: Payer: Self-pay

## 2020-12-11 ENCOUNTER — Ambulatory Visit
Admission: RE | Admit: 2020-12-11 | Discharge: 2020-12-11 | Disposition: A | Discharge status: Home / Self Care | Payer: Medicare Other | Source: Ambulatory Visit | Attending: Urology | Admitting: Urology

## 2020-12-11 VITALS — BP 133/80 | HR 56 | Ht 72.0 in | Wt 215.0 lb

## 2020-12-11 DIAGNOSIS — N201 Calculus of ureter: Secondary | ICD-10-CM

## 2020-12-11 LAB — URINALYSIS, COMPLETE
Bilirubin, UA: NEGATIVE
Glucose, UA: NEGATIVE
Ketones, UA: NEGATIVE
Leukocytes,UA: NEGATIVE
Nitrite, UA: NEGATIVE
Protein,UA: NEGATIVE
RBC, UA: NEGATIVE
Specific Gravity, UA: >1.03 — ABNORMAL HIGH (ref 1.005–1.030)
Urobilinogen, Ur: 0.2 mg/dL (ref 0.2–1.0)
pH, UA: 5.5 (ref 5.0–7.5)

## 2021-06-11 ENCOUNTER — Ambulatory Visit: Payer: Medicare Other | Admitting: Urology

## 2022-05-22 ENCOUNTER — Encounter: Payer: Self-pay | Admitting: *Deleted

## 2022-05-23 ENCOUNTER — Encounter
Admission: RE | Disposition: A | Discharge status: Home / Self Care | Payer: Self-pay | Source: Home / Self Care | Attending: Gastroenterology

## 2022-05-23 ENCOUNTER — Other Ambulatory Visit: Payer: Self-pay

## 2022-05-23 ENCOUNTER — Ambulatory Visit: Payer: Medicare Other | Admitting: Anesthesiology

## 2022-05-23 ENCOUNTER — Encounter: Payer: Self-pay | Admitting: *Deleted

## 2022-05-23 ENCOUNTER — Ambulatory Visit
Admission: RE | Admit: 2022-05-23 | Discharge: 2022-05-23 | Disposition: A | Discharge status: Home / Self Care | Payer: Medicare Other | Attending: Gastroenterology | Admitting: Gastroenterology

## 2022-05-23 DIAGNOSIS — D122 Benign neoplasm of ascending colon: Secondary | ICD-10-CM | POA: Insufficient documentation

## 2022-05-23 DIAGNOSIS — Z1211 Encounter for screening for malignant neoplasm of colon: Secondary | ICD-10-CM | POA: Diagnosis not present

## 2022-05-23 DIAGNOSIS — I10 Essential (primary) hypertension: Secondary | ICD-10-CM | POA: Diagnosis not present

## 2022-05-23 DIAGNOSIS — K640 First degree hemorrhoids: Secondary | ICD-10-CM | POA: Diagnosis not present

## 2022-05-23 HISTORY — DX: Personal history of urinary calculi: Z87.442

## 2022-05-23 HISTORY — PX: COLONOSCOPY WITH PROPOFOL: SHX5780

## 2022-05-23 SURGERY — COLONOSCOPY WITH PROPOFOL
Anesthesia: General

## 2022-05-23 MED ORDER — PROPOFOL 10 MG/ML IV BOLUS
INTRAVENOUS | Status: DC | PRN
  Administered 2022-05-23: 100 mg via INTRAVENOUS

## 2022-05-23 MED ORDER — SODIUM CHLORIDE 0.9 % IV SOLN: INTRAVENOUS | Status: DC

## 2022-05-23 MED ORDER — PROPOFOL 500 MG/50ML IV EMUL
INTRAVENOUS | Status: DC | PRN
  Administered 2022-05-23: 100 ug/kg/min via INTRAVENOUS

## 2022-05-23 MED ORDER — PROPOFOL 1000 MG/100ML IV EMUL
INTRAVENOUS | Status: AC
  Filled 2022-05-23: qty 100

## 2022-05-23 NOTE — Op Note (Signed)
[facility]enter[facility] Patient Name: Kevin HeadlandRobert Grant Procedure Date: 05/23/2022 12:27 PM MRN: [id number] Account #: [id number] Date of Birth: Jun 10, 1950 Admit Type: Outpatient Age: 72 Room: [facility] END[facility] ROOM 3 Gender: Male Note Status: Finalized Instrument Name: Prentice DockerColonoscope [id number] Procedure:             Colonoscopy Indications:           Screening for colorectal malignant neoplasm Providers:             Eather Colasameron Zeynab Klett MD, MD Referring MD:          Danella PentonMark F. Miller, MD (Referring MD) Medicines:             Monitored Anesthesia Care Complications:         No immediate complications. Estimated blood loss:                         Minimal. Procedure:             Pre-Anesthesia Assessment:                        - Prior to the procedure, a History and Physical was                         performed, and patient medications and allergies were                         reviewed. The patient is competent. The risks and                         benefits of the procedure and the sedation options and                         risks were discussed with the patient. All questions                         were answered and informed consent was obtained.                         Patient identification and proposed procedure were                         verified by the physician, the nurse, the                         anesthesiologist, the anesthetist and the technician                         in the endoscopy suite. Mental Status Examination:                         alert and oriented. Airway Examination: normal                         oropharyngeal airway and neck mobility. Respiratory                         Examination: clear to auscultation. CV Examination:  normal. Prophylactic Antibiotics: The patient does not                         require prophylactic antibiotics. Prior                         Anticoagulants: The patient has taken no anticoagulant                          or antiplatelet agents. ASA Grade Assessment: II - A                         patient with mild systemic disease. After reviewing                         the risks and benefits, the patient was deemed in                         satisfactory condition to undergo the procedure. The                         anesthesia plan was to use monitored anesthesia care                         (MAC). Immediately prior to administration of                         medications, the patient was re-assessed for adequacy                         to receive sedatives. The heart rate, respiratory                         rate, oxygen saturations, blood pressure, adequacy of                         pulmonary ventilation, and response to care were                         monitored throughout the procedure. The physical                         status of the patient was re-assessed after the                         procedure.                        After obtaining informed consent, the colonoscope was                         passed under direct vision. Throughout the procedure,                         the patient's blood pressure, pulse, and oxygen                         saturations were monitored continuously. The  Colonoscope was introduced through the anus and                         advanced to the the cecum, identified by appendiceal                         orifice and ileocecal valve. The colonoscopy was                         performed without difficulty. The patient tolerated                         the procedure well. The quality of the bowel                         preparation was good. The ileocecal valve, appendiceal                         orifice, and rectum were photographed. Findings:      The perianal and digital rectal examinations were normal.      A 5 mm polyp was found in the ascending colon. The polyp was       mucous-capped. The polyp was removed with a  cold snare. Resection and       retrieval were complete. Estimated blood loss was minimal.      A 1 mm polyp was found in the distal ascending colon. The polyp was       sessile. The polyp was removed with a jumbo cold forceps. Resection and       retrieval were complete. Estimated blood loss was minimal.      Internal hemorrhoids were found during retroflexion. The hemorrhoids       were Grade I (internal hemorrhoids that do not prolapse).      The exam was otherwise without abnormality on direct and retroflexion       views. Impression:            - One 5 mm polyp in the ascending colon, removed with                         a cold snare. Resected and retrieved.                        - One 1 mm polyp in the distal ascending colon,                         removed with a jumbo cold forceps. Resected and                         retrieved.                        - Internal hemorrhoids.                        - The examination was otherwise normal on direct and                         retroflexion views. Recommendation:        - Discharge patient to home.                        -  Resume previous diet.                        - Continue present medications.                        - Await pathology results.                        - Repeat colonoscopy in 7 years for surveillance.                        - Return to referring physician as previously                         scheduled. Procedure Code(s):     --- Professional ---                        [phone], Colonoscopy, flexible; with removal of                         tumor(s), polyp(s), or other lesion(s) by snare                         technique                        45380, 59, Colonoscopy, flexible; with biopsy, single                         or multiple Diagnosis Code(s):     --- Professional ---                        Z12.11, Encounter for screening for malignant neoplasm                         of colon                        D12.2,  Benign neoplasm of ascending colon                        K64.0, First degree hemorrhoids CPT copyright 2022 American Medical Association. All rights reserved. The codes documented in this report are preliminary and upon coder review may  be revised to meet current compliance requirements. Eather Colas MD, MD 05/23/2022 12:52:03 PM Number of Addenda: 0 Note Initiated On: 05/23/2022 12:27 PM Scope Withdrawal Time: 0 hours 9 minutes 36 seconds  Total Procedure Duration: 0 hours 14 minutes 13 seconds  Estimated Blood Loss:  Estimated blood loss was minimal.      [facility]

## 2022-05-23 NOTE — Interval H&P Note (Signed)
History and Physical Interval Note:  05/23/2022 12:25 PM  Kevin Grant  has presented today for surgery, with the diagnosis of Colon Cancer Screening.  The various methods of treatment have been discussed with the patient and family. After consideration of risks, benefits and other options for treatment, the patient has consented to  Procedure(s): COLONOSCOPY WITH PROPOFOL (N/A) as a surgical intervention.  The patient's history has been reviewed, patient examined, no change in status, stable for surgery.  I have reviewed the patient's chart and labs.  Questions were answered to the patient's satisfaction.     Regis Bill  Ok to proceed with colonoscopy

## 2022-05-23 NOTE — Anesthesia Preprocedure Evaluation (Signed)
Anesthesia Evaluation  Patient identified by MRN, date of birth, ID band Patient awake    Reviewed: Allergy & Precautions, NPO status , Patient's Chart, lab work & pertinent test results  Airway Mallampati: III  TM Distance: >3 FB Neck ROM: Full    Dental  (+) Teeth Intact   Pulmonary neg pulmonary ROS   Pulmonary exam normal breath sounds clear to auscultation       Cardiovascular Exercise Tolerance: Good hypertension, Pt. on medications negative cardio ROS Normal cardiovascular exam Rhythm:Regular Rate:Normal     Neuro/Psych negative neurological ROS  negative psych ROS   GI/Hepatic negative GI ROS, Neg liver ROS,,,  Endo/Other  negative endocrine ROS    Renal/GU negative Renal ROS  negative genitourinary   Musculoskeletal   Abdominal  (+) + obese  Peds negative pediatric ROS (+)  Hematology negative hematology ROS (+)   Anesthesia Other Findings Past Medical History: No date: History of kidney stones No date: Hypertension  Past Surgical History: No date: COLONOSCOPY 11/08/2020: EXTRACORPOREAL SHOCK WAVE LITHOTRIPSY; Right     Comment:  Procedure: EXTRACORPOREAL SHOCK WAVE LITHOTRIPSY (ESWL);              Surgeon: Vanna Scotland, MD;  Location: [facility] ORS;                Service: Urology;  Laterality: Right;  BMI    Body Mass Index: 29.65 kg/m      Reproductive/Obstetrics negative OB ROS                             Anesthesia Physical Anesthesia Plan  ASA: 2  Anesthesia Plan: General   Post-op Pain Management:    Induction: Intravenous  PONV Risk Score and Plan: Propofol infusion and TIVA  Airway Management Planned: Natural Airway  Additional Equipment:   Intra-op Plan:   Post-operative Plan:   Informed Consent: I have reviewed the patients History and Physical, chart, labs and discussed the procedure including the risks, benefits and alternatives for the  proposed anesthesia with the patient or authorized representative who has indicated his/her understanding and acceptance.     Dental Advisory Given  Plan Discussed with: CRNA and Surgeon  Anesthesia Plan Comments:        Anesthesia Quick Evaluation

## 2022-05-23 NOTE — Transfer of Care (Signed)
Immediate Anesthesia Transfer of Care Note  Patient: Kevin Grant  Procedure(s) Performed: COLONOSCOPY WITH PROPOFOL  Patient Location: PACU and Endoscopy Unit  Anesthesia Type:General  Level of Consciousness: drowsy and patient cooperative  Airway & Oxygen Therapy: Patient Spontanous Breathing  Post-op Assessment: Report given to RN and Post -op Vital signs reviewed and stable  Post vital signs: Reviewed and stable  Last Vitals:  Vitals Value Taken Time  BP 110/68 05/23/22 1255  Temp    Pulse    Resp 16 05/23/22 1255  SpO2 100 % 05/23/22 1255    Last Pain:  Vitals:   05/23/22 1207  TempSrc: Temporal  PainSc: 0-No pain         Complications: No notable events documented.

## 2022-05-23 NOTE — Anesthesia Postprocedure Evaluation (Signed)
Anesthesia Post Note  Patient: Kevin Grant  Procedure(s) Performed: COLONOSCOPY WITH PROPOFOL  Patient location during evaluation: PACU Anesthesia Type: General Level of consciousness: awake and oriented Pain management: pain level controlled Vital Signs Assessment: post-procedure vital signs reviewed and stable Respiratory status: spontaneous breathing and respiratory function stable Cardiovascular status: blood pressure returned to baseline Anesthetic complications: no   No notable events documented.   Last Vitals:  Vitals:   05/23/22 1207 05/23/22 1255  BP: (!) 149/62 110/68  Pulse: (!) 56   Resp: 18 16  Temp: (!) 36.1 C 37 C  SpO2: 100% 100%    Last Pain:  Vitals:   05/23/22 1255  TempSrc: Temporal  PainSc: Asleep                 VAN STAVEREN,Yacine Droz

## 2022-05-23 NOTE — H&P (Signed)
Outpatient short stay form Pre-procedure 05/23/2022  Regis Bill, MD  Primary Physician: Danella Penton, MD  Reason for visit:  Screening  History of present illness:    72 y/o gentleman with history of hypertension here for screening colonoscopy. Last colonoscopy in 2010 was normal. No blood thinners. No family history of GI malignancies. No significant abdominal surgeries.    Current Facility-Administered Medications:    0.9 %  sodium chloride infusion, , Intravenous, Continuous, Lanyah Spengler, Rossie Muskrat, MD   0.9 %  sodium chloride infusion, , Intravenous, Continuous, Awais Cobarrubias, Rossie Muskrat, MD, Last Rate: 20 mL/hr at 05/23/22 1209, New Bag at 05/23/22 1209  Medications Prior to Admission  Medication Sig Dispense Refill Last Dose   amLODipine (NORVASC) 2.5 MG tablet Take by mouth.   05/23/2022 at 0730   aspirin 81 MG EC tablet Take by mouth.   Past Week   lisinopril-hydrochlorothiazide (ZESTORETIC) 20-12.5 MG tablet Take by mouth.   05/23/2022 at 0730   metoprolol tartrate (LOPRESSOR) 25 MG tablet Take by mouth.   05/23/2022 at 0730   Multiple Vitamin (MULTI-VITAMIN) tablet Take 1 tablet by mouth daily.   Past Week   simvastatin (ZOCOR) 20 MG tablet Take by mouth.   Past Week     No Known Allergies   Past Medical History:  Diagnosis Date   History of kidney stones    Hypertension     Review of systems:  Otherwise negative.    Physical Exam  Gen: Alert, oriented. Appears stated age.  HEENT: PERRLA. Lungs: No respiratory distress CV: RRR Abd: soft, benign, no masses Ext: No edema    Planned procedures: Proceed with colonoscopy. The patient understands the nature of the planned procedure, indications, risks, alternatives and potential complications including but not limited to bleeding, infection, perforation, damage to internal organs and possible oversedation/side effects from anesthesia. The patient agrees and gives consent to proceed.  Please refer to procedure notes  for findings, recommendations and patient disposition/instructions.     Regis Bill, MD [facility]enterology

## 2022-05-26 ENCOUNTER — Encounter: Payer: Self-pay | Admitting: Gastroenterology

## 2022-05-26 LAB — SURGICAL PATHOLOGY

## 2022-06-24 ENCOUNTER — Encounter: Payer: Self-pay | Admitting: Gastroenterology

## 2022-11-18 ENCOUNTER — Other Ambulatory Visit: Payer: Self-pay | Admitting: Internal Medicine

## 2022-11-18 DIAGNOSIS — R7401 Elevation of levels of liver transaminase levels: Secondary | ICD-10-CM

## 2022-11-24 ENCOUNTER — Ambulatory Visit
Admission: RE | Admit: 2022-11-24 | Discharge: 2022-11-24 | Disposition: A | Discharge status: Home / Self Care | Payer: Medicare Other | Source: Ambulatory Visit | Attending: Internal Medicine | Admitting: Internal Medicine

## 2022-11-24 DIAGNOSIS — R7401 Elevation of levels of liver transaminase levels: Secondary | ICD-10-CM | POA: Insufficient documentation
